# Patient Record
Sex: Female | Born: 1970 | Race: White | Hispanic: No | Marital: Married | Smoking: Never smoker
Health system: Southern US, Community
[De-identification: ages and names within clinical notes are randomized; demographics above are authoritative.]

## PROBLEM LIST (undated history)

## (undated) DIAGNOSIS — K589 Irritable bowel syndrome without diarrhea: Secondary | ICD-10-CM

## (undated) HISTORY — DX: Irritable bowel syndrome, unspecified: K58.9

---

## 1997-08-15 HISTORY — PX: TONSILLECTOMY: SUR1361

## 2008-01-15 ENCOUNTER — Inpatient Hospital Stay (HOSPITAL_COMMUNITY): Admission: AD | Admit: 2008-01-15 | Discharge: 2008-01-17 | Payer: Self-pay | Admitting: Obstetrics and Gynecology

## 2009-08-15 HISTORY — PX: AUGMENTATION MAMMAPLASTY: SUR837

## 2009-09-08 ENCOUNTER — Ambulatory Visit (HOSPITAL_COMMUNITY): Admission: RE | Admit: 2009-09-08 | Discharge: 2009-09-08 | Payer: Self-pay

## 2010-05-24 ENCOUNTER — Emergency Department: Payer: Self-pay | Admitting: Emergency Medicine

## 2010-10-31 LAB — COMPREHENSIVE METABOLIC PANEL
ALT: 19 U/L (ref 0–35)
AST: 19 U/L (ref 0–37)
Alkaline Phosphatase: 41 U/L (ref 39–117)
CO2: 28 mEq/L (ref 19–32)
GFR calc non Af Amer: >60 mL/min (ref >60)
Total Bilirubin: 0.8 mg/dL (ref 0.3–1.2)

## 2010-10-31 LAB — CBC
HCT: 38.7 % (ref 36.0–46.0)
MCHC: 33.9 g/dL (ref 30.0–36.0)
MCV: 97.3 fL (ref 78.0–100.0)
RBC: 3.98 MIL/uL (ref 3.87–5.11)
RDW: 12.7 % (ref 11.5–15.5)

## 2010-12-28 NOTE — H&P (Signed)
NAMECASTELLA, LERNER                ACCOUNT NO.:  [id number]   MEDICAL RECORD NO.:  [id number]          PATIENT TYPE:  INP   LOCATION:  9147                          FACILITY:  [facility]   PHYSICIAN:  Janine Limbo, M.D.DATE OF BIRTH:  June 03, 1971   DATE OF ADMISSION:  01/15/2008  DATE OF DISCHARGE:                              HISTORY & PHYSICAL   HISTORY:  Ms. Filyaw is a 40 year old married white female gravida 4, para  2-0-1-2 at 40-3/7 weeks who presents with regular contractions tonight  and also questionable leaking since 5:00 p.m. with clear fluid noted.  Her pregnancy has been followed by the [facility] Certified  Nurse-Midwife Service and has been remarkable for;  1. Advanced maternal age.  2. History of LGA infant.  3. Low-lying placenta that resolved by 36 weeks' gestation.  4. Group B strep negative.   Her prenatal labs were collected on July 27, 2007, hemoglobin 12.9,  hematocrit 38.8, and platelets 257,000.  Blood type O+, antibody  negative, RPR nonreactive, rubella immune, hepatitis B surface antigen  negative, and HIV nonreactive.  Pap smear within normal limits.  A 1-  hour Glucola from September 11, 2007, was 122.  Culture of the vaginal  tract for group B strep in May 2009 was negative.   HISTORY OF PRESENT PREGNANCY:  The patient presented for care at [facility] at 16-5/7 weeks' gestation.  The patient declined any genetic  testing including amniocentesis.  Anatomy ultrasound at 18 weeks'  gestation shows growth consistent with previous dating confirming [facility] of  Jan 12, 2008.  The placenta was low lying at 0.9 cm from the os.  She  had an early 1-hour Glucola at 22 weeks' gestation due to a history of  an LGA infant that was within normal limits.  She was desiring her  followup ultrasound for the placenta to be done in May 2009 due to  insurance coverage issues that was repeated at 36 weeks' gestation.  The  placenta was no longer low lying.   Growth was within normal limits at 6  pounds 3 ounces with normal amniotic fluid index.  Rest of her prenatal  care has been unremarkable.   OB HISTORY:  She is a gravida 4, para 2-0-1-2.  In December 2003, she  had a vaginal delivery of a female infant weighing 7 pounds and 12 ounces  at 42 weeks' gestation after 8 hours in labor.  She had no anesthesia.  Infant's name was Christiane Ha, and there were no complications.  She had a  midline episiotomy with third-degree extension with that birth.  In  January 2005, she had a vaginal delivery of a female infant weighing 9  pounds and 2 ounces at 41-1/2 weeks' gestation after 7 hours in labor.  She had no anesthesia.  Infant's name was Keene.  There were no  complications.  She had a second-degree laceration with that birth.  In  August 2008, at 4 weeks' gestation, she had a spontaneous AB with no  complications.  This fourth pregnancy is the current pregnancy.   PAST MEDICAL  HISTORY:  She has no medication allergies.   She experienced menarche at the age of 55 with 26 day cycles, lasting 5  days.  She has used oral contraceptives in the past as well as condoms.  She reports having had the usual childhood illnesses.   SURGICAL HISTORY:  Remarkable for wisdom teeth extraction in 1992 and  tonsillectomy in 2000.   FAMILY MEDICAL HISTORY:  Remarkable for paternal grandmother with vocal  cord cancer and paternal grandfather is an alcoholic.  Genetic history  is remarkable only for patient at the age of 17 at delivery.   SOCIAL HISTORY:  The patient is married.  Father of the baby is involved  and supportive.  His name is Forrest.  They are of the [place] faith.  The patient has her masters, as well as the father of the baby and they  are both self-employed in Airline pilot.  They deny any alcohol, tobacco, or  illicit drug use with the pregnancy.   OBJECTIVE:  VITAL SIGNS:  Stable.  GENERAL:  She is afebrile.  HEENT:  Grossly within normal limits.   CHEST:  Clear to auscultation.  HEART:  Regular rate and rhythm.  ABDOMEN:  Gravid in contour with fundal height extending approximately  40 cm above pubic symphysis.  Fetal heart rate is reactive and  reassuring.  Contractions are every 2-4 minutes.  PELVIC:  Cervix is 5 cm, 90% effaced, vertex -2 with questionable  amniotic fluid on the perineum.  EXTREMITIES:  Within normal limits.   ASSESSMENT:  1. Intrauterine pregnancy at term.  2. Active labor.  3. Group B strep negative.   PLAN:  1. Admit to birthing suites.  2. Routine C.N.M. orders.  3. I anticipate normal spontaneous vaginal birth.  4. The patient has a Doula, [employer], who is present, and the      patient desires a noninterventive birth.      Cam Hai, C.N.M.      Janine Limbo, M.D.  Electronically Signed    KS/MEDQ  D:  01/15/2008  T:  01/16/2008  Job:  [id number]

## 2011-05-12 LAB — URINALYSIS, ROUTINE W REFLEX MICROSCOPIC
Bilirubin Urine: NEGATIVE
Glucose, UA: NEGATIVE
Ketones, ur: NEGATIVE
Protein, ur: 100 — AB
pH: 6

## 2011-05-12 LAB — COMPREHENSIVE METABOLIC PANEL
AST: 25
Albumin: 2.7 — ABNORMAL LOW
BUN: 9
CO2: 19
Calcium: 8.6
Chloride: 98
Creatinine, Ser: 0.67
GFR calc Af Amer: >60
GFR calc non Af Amer: >60

## 2011-05-12 LAB — LACTATE DEHYDROGENASE: LDH: 170

## 2011-05-12 LAB — URIC ACID: Uric Acid, Serum: 5.7

## 2011-05-12 LAB — CBC
HCT: 35 — ABNORMAL LOW
Hemoglobin: 10.8 — ABNORMAL LOW
Hemoglobin: 12.1
MCHC: 34.6
MCHC: 34.9
MCV: 93.1
Platelets: 246
RBC: 3.76 — ABNORMAL LOW
RDW: 14.1

## 2011-05-12 LAB — PROTEIN, URINE, 24 HOUR: Collection Interval-UPROT: 24

## 2011-05-12 LAB — URINE MICROSCOPIC-ADD ON

## 2012-04-09 ENCOUNTER — Telehealth: Payer: Self-pay | Admitting: Obstetrics and Gynecology

## 2012-04-09 NOTE — Telephone Encounter (Signed)
Spoke with pt rgd msg pt states lost tampon wants it removed offered pt an appt pt has appt 04/10/12 at 10:30 with ep pt voice understanding

## 2012-04-09 NOTE — Telephone Encounter (Signed)
Triage/epic 

## 2012-04-09 NOTE — Telephone Encounter (Signed)
Lm on vm tcb rgd msg 

## 2012-04-10 ENCOUNTER — Encounter: Payer: Self-pay | Admitting: Obstetrics and Gynecology

## 2012-04-10 ENCOUNTER — Ambulatory Visit (INDEPENDENT_AMBULATORY_CARE_PROVIDER_SITE_OTHER): Payer: Self-pay | Admitting: Obstetrics and Gynecology

## 2012-04-10 VITALS — BP 102/70 | Temp 98.1°F | Ht 69.0 in | Wt 143.0 lb

## 2012-04-10 DIAGNOSIS — T199XXA Foreign body in genitourinary tract, part unspecified, initial encounter: Secondary | ICD-10-CM

## 2012-04-10 NOTE — Progress Notes (Signed)
Regular Periods: yes Mammogram: yes  Monthly Breast Ex.: no Exercise: yes  Tetanus < 10 years: no Seatbelts: yes  NI. Bladder Functn.: no LEAKAGE Abuse at home: no  Daily BM's: yes Stressful Work: yes  Healthy Diet: yes Sigmoid-Colonoscopy: NO  Calcium: yes Medical problems this year: LOST TAMPON   LAST PAP:?  Contraception: VAS. SPOUSE  Mammogram:  2011 NL  PCP: NO  PMH: NO CHANGE  FMH: NO CHANGE  Last Bone Scan: NO

## 2012-04-10 NOTE — Progress Notes (Signed)
41 YO new patient  for removal of a lost tampon.  States this has happened before but it eventually came out, foul smelling with "awful discharge attached". She denies any recent fever, myalgias or rash.  O: Neck: supple, no masses or thyromegaly     Heart: RRR     Lungs: clear     Abdomen: soft, non-tender     Pelvic: EGBUS-wnl, vagina-scant blood with no visible or palpable tampon, cervix-no lesions, uterus normal size, adnexae-no masses  A: Vaginal Foreign Body-not found  P:  AEx ASAP (gave information of [facility] & [facility])  Vestal Markin, PA-C

## 2014-06-16 ENCOUNTER — Encounter: Payer: Self-pay | Admitting: Obstetrics and Gynecology

## 2015-08-31 ENCOUNTER — Ambulatory Visit
Admission: RE | Admit: 2015-08-31 | Discharge: 2015-08-31 | Disposition: A | Discharge status: Home / Self Care | Payer: Self-pay | Source: Ambulatory Visit | Attending: Oncology | Admitting: Oncology

## 2015-08-31 ENCOUNTER — Ambulatory Visit: Payer: Self-pay | Attending: Oncology | Admitting: *Deleted

## 2015-08-31 ENCOUNTER — Other Ambulatory Visit: Payer: Self-pay | Admitting: Oncology

## 2015-08-31 ENCOUNTER — Encounter: Payer: Self-pay | Admitting: *Deleted

## 2015-08-31 VITALS — BP 109/71 | HR 61 | Temp 98.1°F | Ht 66.93 in | Wt 159.1 lb

## 2015-08-31 DIAGNOSIS — N63 Unspecified lump in unspecified breast: Secondary | ICD-10-CM

## 2015-08-31 NOTE — Patient Instructions (Signed)
Gave patient hand-out, Women Staying Healthy, Active and Well from [facility], with education on breast health, pap smears, heart and colon health. 

## 2015-08-31 NOTE — Progress Notes (Signed)
Subjective:     Patient ID: Melinda Campbell, female   DOB: [birth year], 45 y.o.   MRN: [id number]  HPI   Review of Systems     Objective:   Physical Exam  Pulmonary/Chest: Right breast exhibits no inverted nipple, no mass, no nipple discharge, no skin change and no tenderness. Left breast exhibits mass and tenderness. Left breast exhibits no inverted nipple, no nipple discharge and no skin change. Breasts are symmetrical.         Assessment:     45 year old White female [facility] to [facility] by the [facility] for further evaluation of a left breast mass.  Patient states she knows the nodule was not present in September, but she noticed it on December 19 on self breast exam.  States she feels the nodule has gotten larger in the past few weeks.  The patient does have bilateral breast implants, that she thinks are placed behind the muscle.  On clinical breast exam, I can palpate an approximated 2 cm irregular, mobile, firm, very superficial nodule at 1:00 in the left breast.  The nodule is actually visible while completing the clinical breast exam.  The patient can pull the nodule between her fingers and the depth of the nodule is much more prominent.  Taught self breast awareness.  Patient has been screened for eligibility.  She does not have any insurance, Medicare or Medicaid.  She also meets financial eligibility.  Hand-out given on the Affordable Care Act.     Plan:     Will get bilateral diagnostic mammogram with ultrasound.  If no findings on imaging will refer for surgical consult.  The patient is agreeable to the plan.  Will follow-up per [facility] protocol.

## 2015-09-02 ENCOUNTER — Telehealth: Payer: Self-pay | Admitting: *Deleted

## 2015-09-02 NOTE — Telephone Encounter (Signed)
Unable to leave a message.  Her mailbox is full.

## 2015-09-08 ENCOUNTER — Telehealth: Payer: Self-pay | Admitting: *Deleted

## 2015-09-08 ENCOUNTER — Encounter: Payer: Self-pay | Admitting: General Surgery

## 2015-09-08 DIAGNOSIS — N63 Unspecified lump in unspecified breast: Secondary | ICD-10-CM

## 2015-09-08 NOTE — Telephone Encounter (Signed)
Patient returned my call.  I have scheduled her to see Dr. Lemar Livings on 09/17/15 @ 3:15.  I have also scheduled her 6 month follow up mammogram for February 25, 2016 @ 1:30 at the [facility].  A letter will be mailed to inform patient of the date.

## 2015-09-08 NOTE — Telephone Encounter (Signed)
Left patient a message to return my call.  Although she had a normal mammogram, she did have a palpable nodule.  I want to offer her a surgical consult.

## 2015-09-10 ENCOUNTER — Encounter: Payer: Self-pay | Admitting: *Deleted

## 2015-09-17 ENCOUNTER — Encounter: Payer: Self-pay | Admitting: General Surgery

## 2015-09-17 ENCOUNTER — Ambulatory Visit (INDEPENDENT_AMBULATORY_CARE_PROVIDER_SITE_OTHER): Payer: PRIVATE HEALTH INSURANCE | Admitting: General Surgery

## 2015-09-17 VITALS — BP 96/58 | HR 65 | Resp 12 | Ht 69.0 in | Wt 157.0 lb

## 2015-09-17 DIAGNOSIS — N6001 Solitary cyst of right breast: Secondary | ICD-10-CM | POA: Diagnosis not present

## 2015-09-17 DIAGNOSIS — N6002 Solitary cyst of left breast: Secondary | ICD-10-CM | POA: Diagnosis not present

## 2015-09-17 NOTE — Patient Instructions (Addendum)
The patient is aware to call back for any questions or concerns. Patient to return as needed. 

## 2015-09-17 NOTE — Progress Notes (Signed)
Patient ID: Melinda Campbell, female   DOB: [birth year], 45 y.o.   MRN: [id number]  Chief Complaint  Patient presents with  . Other    mammogram    HPI Melinda Campbell is a 45 y.o. female who presents for a breast evaluation. The most recent mammogram and  ultrasound was done on 08/31/15  Patient does not perform regular self breast checks and does not  gets regular mammograms done.  Patient noticed some lumps in her breast around December 2016. She states she thinks the left lump is bigger now.     She has not experienced significant breast tenderness, and denies daily manipulation of the areas in question. Her menses have been typically 26 days, but in the last year she is noted some irregularity with periods occurring as frequently as every 23 days with moderately heavy flow. This returned to her baseline when she made use of a diet free of gluten. She reports she's making use of multiple homeopathic medicines for reported adrenal insufficiency. From my conversation, I'm unclear of how or if any adrenal blood tests were actually monitored or whether this is purely based on her clinical symptoms of extreme fatigue.    The patient was born in [place] to parents native of [place]. 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  I personally reviewed the patient's history. HPI  Past Medical History  Diagnosis Date  . IBS (irritable bowel syndrome)     Past Surgical History  Procedure Laterality Date  . Augmentation mammaplasty Bilateral 2011    bilateral breast implants  . Tonsillectomy  1999    Family History  Problem Relation Age of Onset  . Cancer Neg Hx     Social History Social History  Substance Use Topics  . Smoking status: Never Smoker   . Smokeless tobacco: Never Used  . Alcohol Use: Yes    No Known Allergies  No current outpatient prescriptions on file.   No current facility-administered medications for this visit.    Review of  Systems Review of Systems  Constitutional: Negative.   Respiratory: Negative.   Cardiovascular: Negative.     Blood pressure 96/58, pulse 65, resp. rate 12, height  (1.753 m), weight 157 lb (71.215 kg), last menstrual period 09/05/2015.  Physical Exam Physical Exam  Constitutional: She is oriented to person, place, and time. She appears well-developed and well-nourished.  Eyes: Conjunctivae are normal. No scleral icterus.  Neck: Neck supple.  Cardiovascular: Normal rate, regular rhythm and normal heart sounds.   Pulmonary/Chest: Effort normal and breath sounds normal. Right breast exhibits no inverted nipple, no mass, no nipple discharge, no skin change and no tenderness. Left breast exhibits mass. Left breast exhibits no inverted nipple, no nipple discharge, no skin change and no tenderness.    Left breast 1 cm mobile mass 1 cm from nipple at 1 o'clock.   Lymphadenopathy:    She has no cervical adenopathy.    She has no axillary adenopathy.  Neurological: She is alert and oriented to person, place, and time.  Skin: Skin is warm and dry.    Data Reviewed Bilateral mammograms and ultrasound dated 08/31/2015 were reviewed. Subpectoral implants noted. Nodular density in the upper-outer quadrant of the left breast shown to be a simple cyst on ultrasound. Focal thickening in the upper outer quadrant left breast suggested to be multiple cysts. Extensive calcifications in the left breast felt to represent a benign process. BI-RADS-3.  Assessment  Benign breast exam, asymptomatic cysts.    Plan    The patient has a follow-up scheduled with the mammography Center in regards to the microcalcifications as well as the cluster of microcyst in the right breast. Repeat surgical assessment would not be required unless she becomes symptomatic. She has been discouraged from manipulation of the known upper outer quadrant left breast cyst to minimize local inflammation.   Patient to return  as needed.  PCP:  Preston Fleeting, Ref [facility] This information has been scribed by Ples Specter CMA.    Earline Mayotte 09/18/2015, 7:36 AM

## 2015-09-18 DIAGNOSIS — N6009 Solitary cyst of unspecified breast: Secondary | ICD-10-CM | POA: Insufficient documentation

## 2015-09-21 ENCOUNTER — Encounter: Payer: Self-pay | Admitting: *Deleted

## 2015-09-21 NOTE — Progress Notes (Signed)
Patient to return for 6 month follow up mammogram as indicated per Dr. Lemar Livings.  [facility] to [place].

## 2016-02-25 ENCOUNTER — Ambulatory Visit
Admission: RE | Admit: 2016-02-25 | Discharge: 2016-02-25 | Disposition: A | Discharge status: Home / Self Care | Payer: Self-pay | Source: Ambulatory Visit | Attending: Oncology | Admitting: Oncology

## 2016-02-25 DIAGNOSIS — N63 Unspecified lump in unspecified breast: Secondary | ICD-10-CM

## 2016-02-26 ENCOUNTER — Encounter: Payer: Self-pay | Admitting: *Deleted

## 2016-02-26 NOTE — Progress Notes (Signed)
Letter mailed to inform patient of her mammogram results and need for a six month follow-up at which time she will be due for her annual [facility] screening.  Appointment scheduled for 08/31/16 @ 8:00.  [facility] to [place].

## 2016-08-31 ENCOUNTER — Ambulatory Visit: Payer: Self-pay

## 2016-09-28 ENCOUNTER — Ambulatory Visit: Payer: Self-pay

## 2016-10-05 ENCOUNTER — Ambulatory Visit: Payer: Self-pay | Attending: Oncology

## 2017-01-18 ENCOUNTER — Ambulatory Visit: Payer: Self-pay | Attending: Oncology

## 2017-01-18 VITALS — BP 127/81 | HR 72 | Temp 97.8°F | Resp 18 | Ht 69.0 in | Wt 189.0 lb

## 2017-01-18 DIAGNOSIS — R920 Mammographic microcalcification found on diagnostic imaging of breast: Secondary | ICD-10-CM

## 2017-01-18 DIAGNOSIS — N63 Unspecified lump in unspecified breast: Secondary | ICD-10-CM

## 2017-01-18 NOTE — Progress Notes (Signed)
Subjective:     Patient ID: Melinda DeedsVanessa Campbell, female   DOB: [birth year], 46 y.o.   MRN: [id number]  HPI   Review of Systems     Objective:   Physical Exam  Pulmonary/Chest:    Bilateral breast implants       Assessment:     46 year old patient presents for [facility] clinic visit.  To have 6 month follow-up mammogram for bilateral calcifications, right breast cysts.  Patient did not come for earlier scheduled appointments.  Patient screened, and meets [facility] eligibility.  Patient does not have insurance, Medicare or Medicaid.  Handout given on Affordable Care Act. Instructed patient on breast self-exam using teach back method.  CBE unremarkable.  No mass or lump palpated.     Plan:     Sent for bilateral screening mammogram.

## 2017-02-17 ENCOUNTER — Ambulatory Visit
Admission: RE | Admit: 2017-02-17 | Discharge: 2017-02-17 | Disposition: A | Discharge status: Home / Self Care | Payer: Self-pay | Source: Ambulatory Visit | Attending: Oncology | Admitting: Oncology

## 2017-02-17 ENCOUNTER — Other Ambulatory Visit: Payer: Self-pay | Admitting: Oncology

## 2017-02-17 DIAGNOSIS — R920 Mammographic microcalcification found on diagnostic imaging of breast: Secondary | ICD-10-CM

## 2017-02-17 DIAGNOSIS — N63 Unspecified lump in unspecified breast: Secondary | ICD-10-CM

## 2017-02-22 NOTE — Progress Notes (Addendum)
Patient to return in one year for annual screening and follow-up Birads 3 mammogram results.  Mailed notification of appointment scheduled for March 07, 2018 at 12:30.  Copy to [facility].

## 2017-04-25 IMAGING — MG MM DIAG BREAST W/IMPLANT TOMO BILATERAL
8 of 23 series · 8 of 39 positions shown · non-contrast
Comparison: Previous exam(s).

CLINICAL DATA: Patient presents for evaluation of palpable
abnormality within the upper-outer left breast. Additionally the
patient reports palpable thickening within the superior right breast
anteriorly.

EXAM:
DIGITAL DIAGNOSTIC BILATERAL MAMMOGRAM WITH 3D TOMOSYNTHESIS WITH
CAD
ULTRASOUND BILATERAL BREAST

[R CC (1 of 2)]
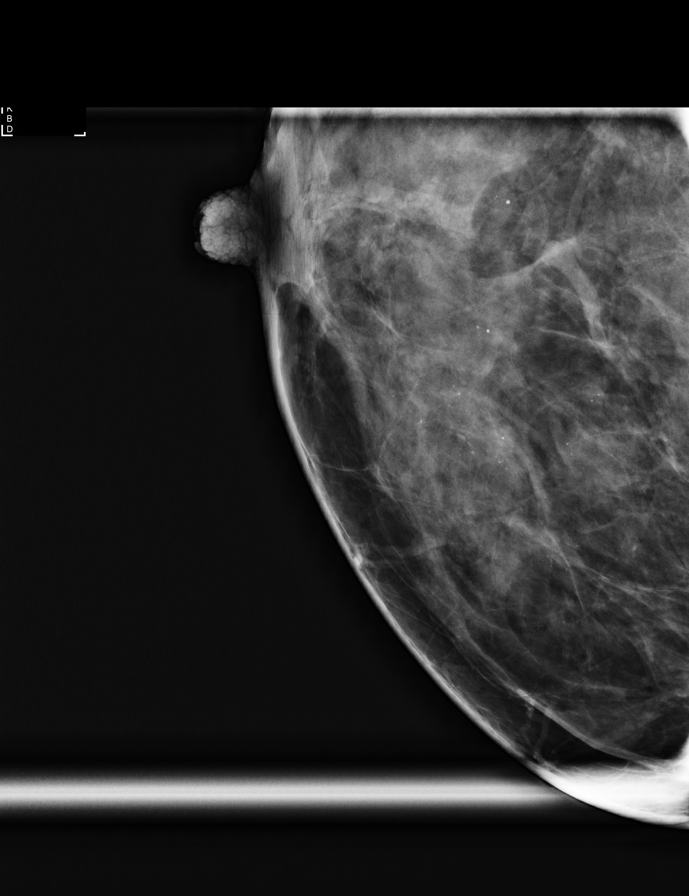

[L CC (1 of 2)]
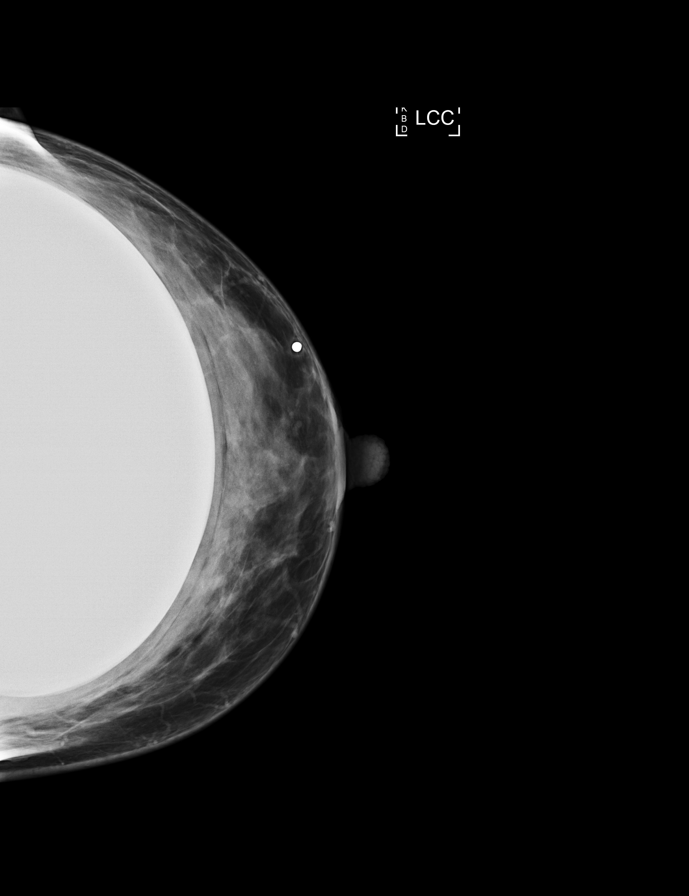

[L CC (2 of 2)]
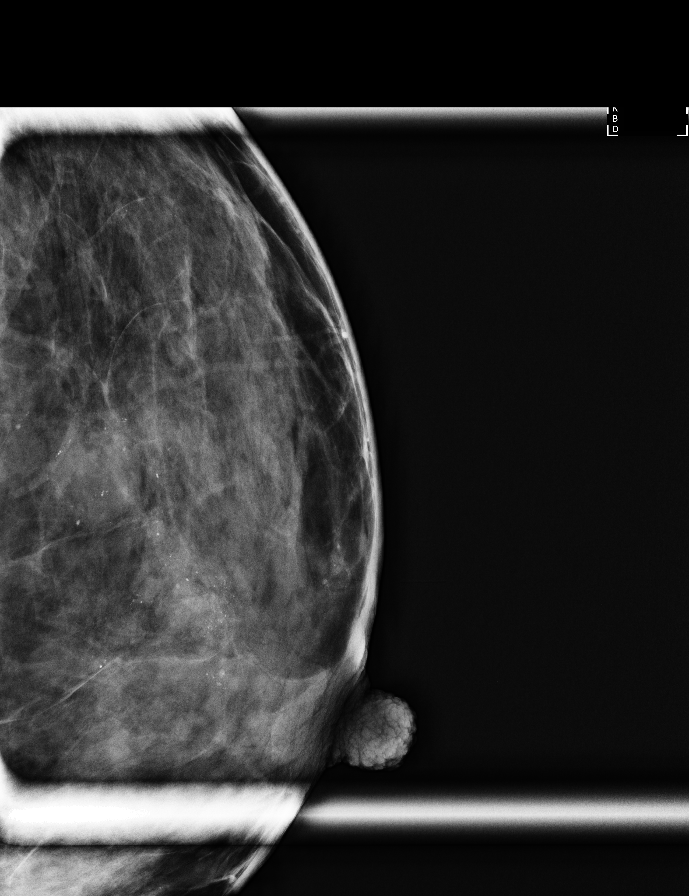

[L MLO (1 of 2)]
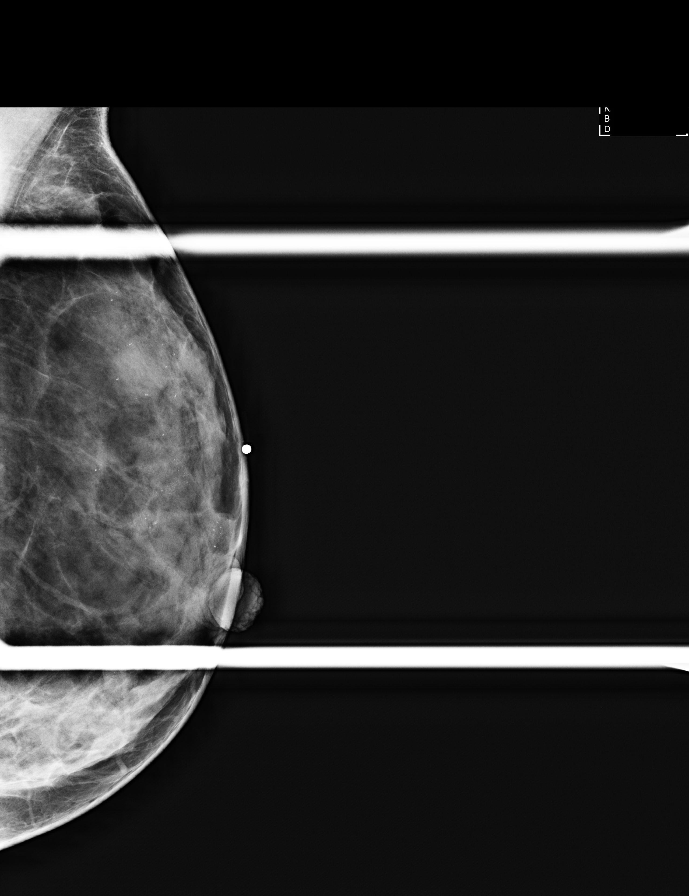

[L MLO (2 of 2)]
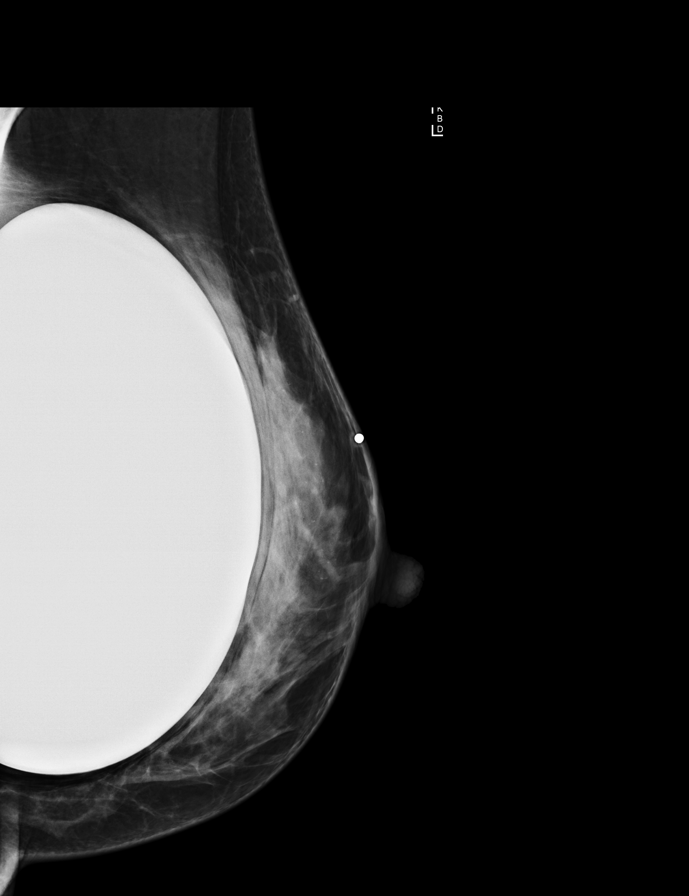

[L ML]
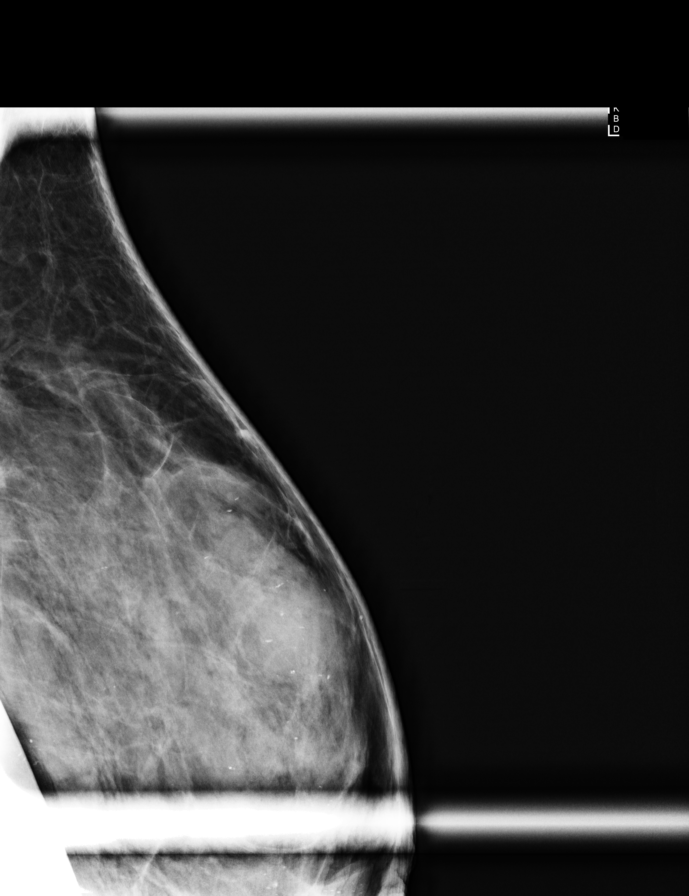

[R CC (2 of 2)]
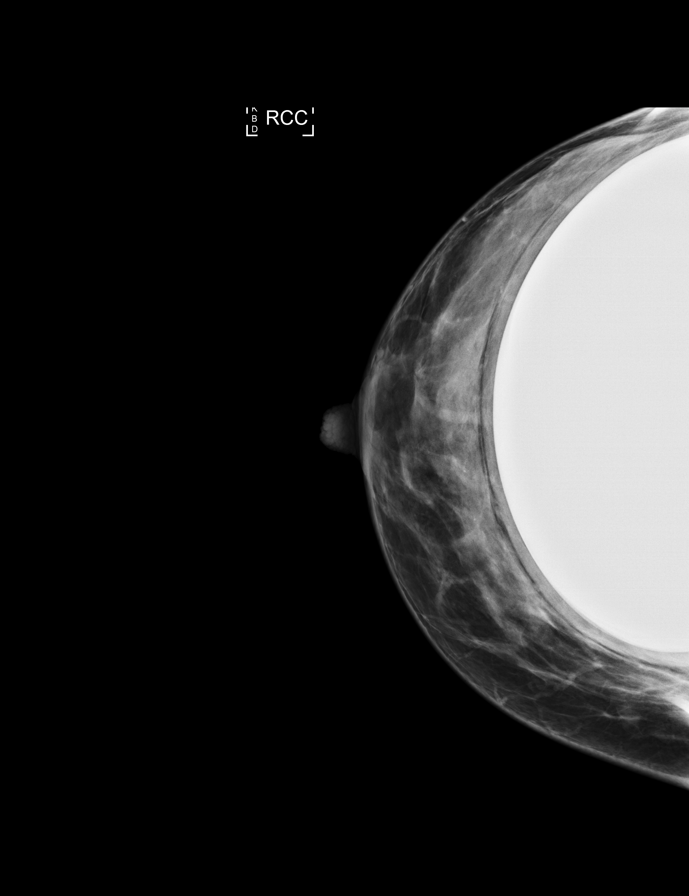

[R ML]
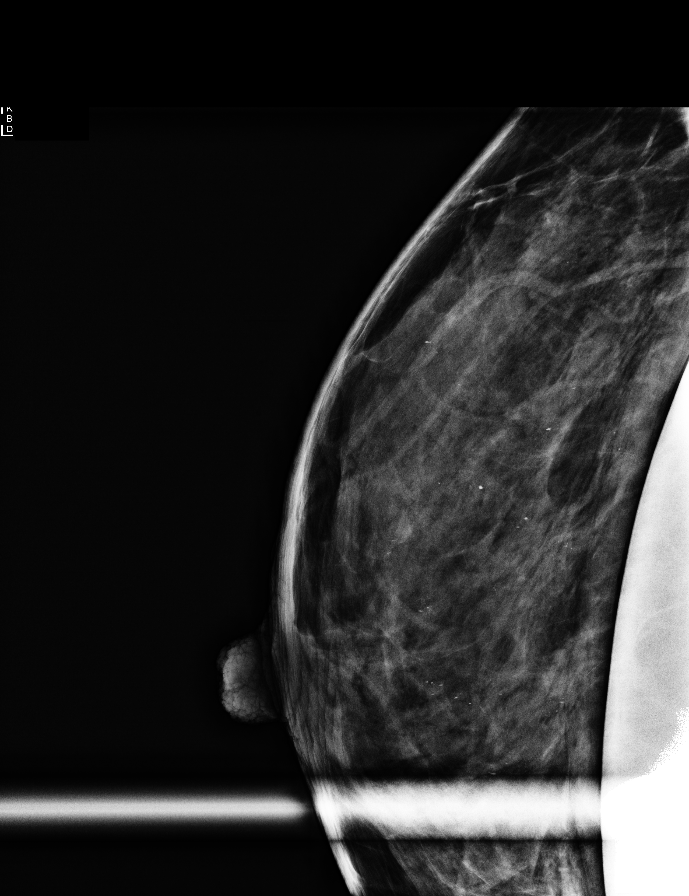

[8 of 39 positions shown; findings below may reference images not displayed]

ACR Breast Density Category c: The breast tissue is heterogeneously
dense, which may obscure small masses.
FINDINGS: Interval placement of bilateral retropectoral silicone implants.
Interval development of scattered calcifications within the
upper-outer quadrants of the right and left breasts. Calcifications
were further evaluated with magnification CC and true lateral views.
These calcifications predominantly layer on the true lateral view,
most compatible with milk of calcium. No suspicious masses or areas
of architectural distortion identified within either breast.

Mammographic images were processed with CAD.

On physical exam, I palpate a mobile pea-sized mass within the
superior left breast. No discrete mass is palpated within the
superior right breast.

Targeted ultrasound is performed, showing a 1.4 x 1.1 x 1.3 cm
simple cyst within the left breast 12 o'clock position 4 cm from the
nipple. Within the right breast 12 o'clock position 2 cm from nipple
there is a 5 x 3 x 6 mm probable cluster of microcysts.
IMPRESSION: Probably benign bilateral breast calcifications, favored to
represent milk of calcium.

Probably benign right breast mass, most compatible with cluster of
microcysts.

Palpable abnormality within the left breast corresponds with a
benign simple cyst.

RECOMMENDATION:
Bilateral diagnostic mammography with magnification views and right
breast ultrasound in 6 months to demonstrate stability of probably
benign findings.

I have discussed the findings and recommendations with the patient.
Results were also provided in writing at the conclusion of the
visit. If applicable, a reminder letter will be sent to the patient
regarding the next appointment.

BI-RADS CATEGORY  3: Probably benign.

## 2017-06-03 ENCOUNTER — Emergency Department
Admission: EM | Admit: 2017-06-03 | Discharge: 2017-06-04 | Disposition: A | Discharge status: Home / Self Care | Payer: No Typology Code available for payment source | Attending: Emergency Medicine | Admitting: Emergency Medicine

## 2017-06-03 ENCOUNTER — Emergency Department: Payer: No Typology Code available for payment source

## 2017-06-03 DIAGNOSIS — S8001XA Contusion of right knee, initial encounter: Secondary | ICD-10-CM | POA: Diagnosis not present

## 2017-06-03 DIAGNOSIS — S161XXA Strain of muscle, fascia and tendon at neck level, initial encounter: Secondary | ICD-10-CM | POA: Insufficient documentation

## 2017-06-03 DIAGNOSIS — Y92410 Unspecified street and highway as the place of occurrence of the external cause: Secondary | ICD-10-CM | POA: Diagnosis not present

## 2017-06-03 DIAGNOSIS — Y999 Unspecified external cause status: Secondary | ICD-10-CM | POA: Insufficient documentation

## 2017-06-03 DIAGNOSIS — S199XXA Unspecified injury of neck, initial encounter: Secondary | ICD-10-CM | POA: Diagnosis present

## 2017-06-03 DIAGNOSIS — S8002XA Contusion of left knee, initial encounter: Secondary | ICD-10-CM

## 2017-06-03 DIAGNOSIS — Y9389 Activity, other specified: Secondary | ICD-10-CM | POA: Diagnosis not present

## 2017-06-03 DIAGNOSIS — S39012A Strain of muscle, fascia and tendon of lower back, initial encounter: Secondary | ICD-10-CM | POA: Diagnosis not present

## 2017-06-03 MED ORDER — HYDROCODONE-ACETAMINOPHEN 5-325 MG PO TABS
1.0000 | ORAL_TABLET | Freq: Once | ORAL | Status: AC
  Administered 2017-06-03: 1 via ORAL
  Filled 2017-06-03: qty 1
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
## 2017-06-04 MED ORDER — MELOXICAM 15 MG PO TABS: 15.0000 mg | ORAL_TABLET | Freq: Every day | ORAL | 0 refills | Status: AC

## 2017-06-04 MED ORDER — KETOROLAC TROMETHAMINE 60 MG/2ML IM SOLN
60.0000 mg | Freq: Once | INTRAMUSCULAR | Status: AC
  Administered 2017-06-04: 60 mg via INTRAMUSCULAR
  Filled 2017-06-04: qty 2

## 2017-06-04 MED ORDER — ORPHENADRINE CITRATE 30 MG/ML IJ SOLN
60.0000 mg | Freq: Once | INTRAMUSCULAR | Status: AC
  Administered 2017-06-04: 60 mg via INTRAMUSCULAR
  Filled 2017-06-04: qty 2

## 2017-06-04 MED ORDER — HYDROCODONE-ACETAMINOPHEN 5-325 MG PO TABS: 1.0000 | ORAL_TABLET | ORAL | 0 refills | Status: AC | PRN

## 2017-06-04 MED ORDER — METHOCARBAMOL 500 MG PO TABS: 500.0000 mg | ORAL_TABLET | Freq: Four times a day (QID) | ORAL | 0 refills | Status: AC

## 2018-03-07 ENCOUNTER — Ambulatory Visit: Payer: Self-pay

## 2018-03-14 ENCOUNTER — Ambulatory Visit: Payer: Self-pay

## 2018-05-14 ENCOUNTER — Other Ambulatory Visit: Payer: Self-pay

## 2018-05-14 ENCOUNTER — Ambulatory Visit
Admission: RE | Admit: 2018-05-14 | Discharge: 2018-05-14 | Disposition: A | Discharge status: Home / Self Care | Payer: Self-pay | Source: Ambulatory Visit | Attending: Oncology | Admitting: Oncology

## 2018-05-14 ENCOUNTER — Ambulatory Visit: Payer: Self-pay | Attending: Oncology

## 2018-05-14 ENCOUNTER — Encounter (INDEPENDENT_AMBULATORY_CARE_PROVIDER_SITE_OTHER): Payer: Self-pay

## 2018-05-14 VITALS — BP 123/84 | HR 76 | Temp 97.2°F | Ht 72.0 in | Wt 205.0 lb

## 2018-05-14 DIAGNOSIS — R920 Mammographic microcalcification found on diagnostic imaging of breast: Secondary | ICD-10-CM | POA: Insufficient documentation

## 2018-05-14 DIAGNOSIS — N63 Unspecified lump in unspecified breast: Secondary | ICD-10-CM

## 2018-05-14 DIAGNOSIS — Z0000 Encounter for general adult medical examination without abnormal findings: Secondary | ICD-10-CM

## 2018-05-14 NOTE — Progress Notes (Addendum)
  Subjective:     Patient ID: Melinda Campbell, female   DOB: June 12, 1971, 47 y.o.   MRN: [id number]  HPI   Review of Systems     Objective:   Physical Exam  Pulmonary/Chest: Right breast exhibits no inverted nipple, no mass, no nipple discharge, no skin change and no tenderness. Left breast exhibits no mass, no nipple discharge, no skin change and no tenderness.  Bilateral breast implants         Assessment:     47 year old patient returns for BCCCP screening, and follow-up bilateral breast calcifications, right breast cysts.    Patient screened, and meets BCCCP eligibility.  Patient does not have insurance, Medicare or Medicaid.  Handout given on Affordable Care Act.  Instructed patient on breast self awareness using teach back method.  Clinical breast exam unremarkable.  Patient has bilateral breast implants.   Pelvic exam normal.  Patient reports she is at the end of her menstrual cycle.  Scant bleeding noted on pap collection. She has 3 teenage children.  Works from home.    Plan:     Sent for bilateral diagnostic mammogram, and utrasound.  Specimen collected for pap.

## 2018-05-15 NOTE — Progress Notes (Signed)
Mammogram results Birads 2;  Pap results pending.  Letter mailed from [facility] to notify of normal mammogram results.

## 2018-05-19 LAB — PAP LB AND HPV HIGH-RISK
HPV, HIGH-RISK: NEGATIVE
PAP Smear Comment: 0

## 2019-08-20 NOTE — Progress Notes (Signed)
Phoned patient to prescreen BCCCP eligibility.  To go straight to [facility] for mammogram 08/21/2019 at 8:30.  Denies any breast problems.

## 2019-08-21 ENCOUNTER — Encounter: Payer: Self-pay | Admitting: *Deleted

## 2019-08-21 ENCOUNTER — Other Ambulatory Visit: Payer: Self-pay | Admitting: Oncology

## 2019-08-21 ENCOUNTER — Ambulatory Visit: Payer: Self-pay | Attending: Oncology | Admitting: *Deleted

## 2019-08-21 ENCOUNTER — Ambulatory Visit
Admission: RE | Admit: 2019-08-21 | Discharge: 2019-08-21 | Disposition: A | Discharge status: Home / Self Care | Payer: Self-pay | Source: Ambulatory Visit | Attending: Oncology | Admitting: Oncology

## 2019-08-21 ENCOUNTER — Other Ambulatory Visit: Payer: Self-pay

## 2019-08-21 DIAGNOSIS — Z0000 Encounter for general adult medical examination without abnormal findings: Secondary | ICD-10-CM

## 2019-08-21 NOTE — Progress Notes (Signed)
  Subjective:     Patient ID: Melinda Campbell, female   DOB: [birth year], 49 y.o.   MRN: [id number]  HPI   Review of Systems     Objective:   Physical Exam     Assessment:     Due to Covid 19 pandemic a televisit was completed to enroll patient into our BCCCP program and complete her health history.   Verbal consent obtained.  Patient denies any breast problems.  Last pap on 05/14/18 was negative / negative.  Next pap due in 2024.  Patient presented directly to the [facility] for her screening mammogram.  See Dondra Spry breast cancer risk assessment below:   Risk Assessment    Risk Scores      08/21/2019 05/14/2018   Last edited by: Scarlett Presto, RN Scarlett Presto, RN   5-year risk: 0.9 % 0.8 %   Lifetime risk: 9.6 % 9.7 %             Plan:     Screening mammogram ordered.  Will follow up per BCCCP protocol.

## 2019-08-21 NOTE — Progress Notes (Signed)
Letter mailed from the [facility] to inform patient of her normal mammogram results.  Patient is to follow-up with annual screening in one year.
